# Patient Record
Sex: Male | Born: 2005 | Race: White | Hispanic: Yes | Marital: Single | Smoking: Never smoker
Health system: Southern US, Community
[De-identification: ages and names within clinical notes are randomized; demographics above are authoritative.]

## PROBLEM LIST (undated history)

## (undated) ENCOUNTER — Emergency Department (HOSPITAL_COMMUNITY): Source: Home / Self Care

## (undated) DIAGNOSIS — M1990 Unspecified osteoarthritis, unspecified site: Secondary | ICD-10-CM

## (undated) DIAGNOSIS — T7840XA Allergy, unspecified, initial encounter: Secondary | ICD-10-CM

## (undated) DIAGNOSIS — J45909 Unspecified asthma, uncomplicated: Secondary | ICD-10-CM

## (undated) SURGERY — CHEST TUBE INSERTION
Anesthesia: LOCAL | Laterality: Right

---
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
## 2024-06-12 ENCOUNTER — Emergency Department (HOSPITAL_COMMUNITY)

## 2024-06-12 ENCOUNTER — Other Ambulatory Visit: Payer: Self-pay

## 2024-06-12 ENCOUNTER — Emergency Department (HOSPITAL_COMMUNITY)
Admission: EM | Admit: 2024-06-12 | Discharge: 2024-06-12 | Disposition: A | Discharge status: Home / Self Care | Attending: Emergency Medicine | Admitting: Emergency Medicine

## 2024-06-12 DIAGNOSIS — W228XXA Striking against or struck by other objects, initial encounter: Secondary | ICD-10-CM | POA: Insufficient documentation

## 2024-06-12 DIAGNOSIS — S6992XA Unspecified injury of left wrist, hand and finger(s), initial encounter: Secondary | ICD-10-CM | POA: Diagnosis present

## 2024-06-12 DIAGNOSIS — J45909 Unspecified asthma, uncomplicated: Secondary | ICD-10-CM | POA: Diagnosis not present

## 2024-06-12 DIAGNOSIS — S6010XA Contusion of unspecified finger with damage to nail, initial encounter: Secondary | ICD-10-CM

## 2024-06-12 DIAGNOSIS — S62633A Displaced fracture of distal phalanx of left middle finger, initial encounter for closed fracture: Secondary | ICD-10-CM | POA: Insufficient documentation

## 2024-06-12 DIAGNOSIS — S62639A Displaced fracture of distal phalanx of unspecified finger, initial encounter for closed fracture: Secondary | ICD-10-CM
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
## 2024-06-12 NOTE — Discharge Instructions (Signed)
 Please use Tylenol  or ibuprofen  for pain.  You may use 600 mg ibuprofen  every 6 hours or 1000 mg of Tylenol  every 6 hours.  You may choose to alternate between the 2.  This would be most effective.  Not to exceed 4 g of Tylenol  within 24 hours.  Not to exceed 3200 mg ibuprofen  24 hours.  As we discussed other than keeping the finger in a splint to help protect it from getting bumped and hurting more, there is not any additional acute treatment, because of the bleeding under your fingernail it is possible that you will lose the fingernail in the next few weeks, otherwise it may take quite some time for the fingernail to grow out appropriately, I expect that with the underlying fracture you will have some soreness and tenderness of the distal finger for around 4 weeks.

## 2024-06-12 NOTE — ED Notes (Signed)
 Pt verbalized understanding of discharge instructions. Opportunity for questions provided.

## 2024-06-12 NOTE — ED Notes (Signed)
 Patient transported to X-ray
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
